# Patient Record
Sex: Male | Born: 2000 | Race: White | Hispanic: No
Health system: Southern US, Community
[De-identification: ages and names within clinical notes are randomized; demographics above are authoritative.]

## PROBLEM LIST (undated history)

## (undated) DIAGNOSIS — G43909 Migraine, unspecified, not intractable, without status migrainosus: Secondary | ICD-10-CM

## (undated) HISTORY — PX: CIRCUMCISION: SUR203

---
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
## 2019-09-27 ENCOUNTER — Other Ambulatory Visit: Payer: Self-pay

## 2019-10-04 ENCOUNTER — Other Ambulatory Visit: Payer: Self-pay

## 2020-07-24 ENCOUNTER — Encounter (HOSPITAL_COMMUNITY): Payer: Self-pay | Admitting: Emergency Medicine
